# Patient Record
Sex: Male | Born: 1945 | Race: White | Hispanic: No | Marital: Married
Health system: Southern US, Community
[De-identification: ages and names within clinical notes are randomized; demographics above are authoritative.]

---

## 2014-11-20 ENCOUNTER — Inpatient Hospital Stay: Payer: Self-pay | Admitting: Internal Medicine

## 2014-11-21 DIAGNOSIS — I4891 Unspecified atrial fibrillation: Secondary | ICD-10-CM

## 2014-11-21 DIAGNOSIS — J9600 Acute respiratory failure, unspecified whether with hypoxia or hypercapnia: Secondary | ICD-10-CM

## 2014-11-21 DIAGNOSIS — D649 Anemia, unspecified: Secondary | ICD-10-CM

## 2014-11-21 DIAGNOSIS — I471 Supraventricular tachycardia: Secondary | ICD-10-CM

## 2014-11-23 ENCOUNTER — Ambulatory Visit (HOSPITAL_COMMUNITY)
Admission: AD | Admit: 2014-11-23 | Discharge: 2014-11-23 | Disposition: A | Discharge status: Home / Self Care | Payer: Self-pay | Source: Other Acute Inpatient Hospital | Attending: Internal Medicine | Admitting: Internal Medicine

## 2014-11-23 DIAGNOSIS — R0602 Shortness of breath: Secondary | ICD-10-CM | POA: Insufficient documentation

## 2015-01-25 NOTE — Consult Note (Signed)
General Aspect 69 year old Caucasian male with past medical history significant for history of COPD, history of tobacco abuse, hypothyroidism, history of head and neck cancer, status post radiation therapy to the neck after which he developed dysphagia, history of aspiration pneumonitis in the past comes to the hospital with shortness of breath.  Cardiology was consulted for tachycardia.  The patient has been short of breath for the past few days. He was noted to have O2 saturations in 70-79 on 4 liters of oxygen through facial mask and he was brought to Emergency Room for further evaluation.   In the Emergency Room he was noted to be febrile with temperature of 103, tachycardic with heart rate ranging from 100s to 160s.   white blood cell count was also found to be elevated at 25,000. Chest x-ray reveals possible pneumonia.  Review of tele and EKG shows possible SVT with rates of 150s. Predominantly in normal sinus rhythm with rates in the 80s to 90 range. Acute onset of several runs of narrow complex arrhythmia with rates in the 150s lasting for hours at a time. He denies any sx. Unable to exclude 2:1 atrial flutter, though EKG more consistent with SVT.   Physical Exam:  GEN well developed, well nourished, cachectic, thin, critically ill appearing   HEENT hearing intact to voice, moist oral mucosa   NECK supple   RESP postive use of accessory muscles  no use of accessory muscles  rhonchi  crackles   CARD Regular rate and rhythm   ABD soft  normal BS   LYMPH negative neck   EXTR negative edema   SKIN normal to palpation   NEURO motor/sensory function intact   PSYCH alert, A+O to time, place, person, good insight   Review of Systems:  Subjective/Chief Complaint SOB   General: No Complaints   Skin: No Complaints   ENT: No Complaints   Eyes: No Complaints   Neck: No Complaints   Respiratory: Frequent cough  Short of breath   Cardiovascular: No Complaints    Gastrointestinal: No Complaints   Genitourinary: No Complaints   Vascular: No Complaints   Musculoskeletal: No Complaints   Neurologic: No Complaints   Hematologic: No Complaints   Endocrine: No Complaints   Psychiatric: No Complaints   Review of Systems: All other systems were reviewed and found to be negative   Medications/Allergies Reviewed Medications/Allergies reviewed   Family & Social History:  Family and Social History:  Family History Coronary Artery Disease    Social History positive  tobacco   + Tobacco Prior (greater than 1 year)    Place of Living Nursing Home      gastrostomy tube:    aspiration pneumonia:    secondary malignant neoplasm of genital organs:    squamous cell carcinoma:    anemia:    hypertension:    hypothyroidism:    clostridium difficile:        Admit Diagnosis:   SPECIFIED WHETHER WITH HYPOXIA OR HYPO.: Onset Date: 21-Nov-2014, Status: Active, Description: SPECIFIED WHETHER WITH HYPOXIA OR HYPO.  Home Medications: Medication Instructions Status  metoprolol 5 milligram(s) injectable every 4 hours, As needed, tachycardia Active  Vital 1.5 Cal RTH   74m/hr  flush with 25cc h2o flushes q1H Active  budesonide 0.5 mg/2 mL inhalation suspension 0.5 milligram(s) inhaled 2 times a day Active  lactobacillus acidophilus 1 cap(s) orally 3 times a day Active  piperacillin-tazobactam 3.375 gram(s) intravenous every 8 hours Active  vancomycin 750 milligram(s) intravenous every 8  hours Active  budesonide-formoterol 160 mcg-4.5 mcg/inh inhalation aerosol 2 puff(s) inhaled 2 times a day Active  albuterol 2.5 milligram(s) inhaled every 4 hours Active  morphine 2 milligram(s) injectable every 4 hours, As needed, severe pain (7-10/10) Active  heparin 5000 unit(s) subcutaneous every 8 hours Active  oxyCODONE 2.5 milligram(s) orally every 6 hours, As needed, moderate pain (4-6/10) Active  methylPREDNISolone 40 milligram(s) injectable once a  day Active  acetaminophen 325 mg oral tablet 2 tab(s) orally every 4 hours, As needed, mild pain (1-3/10) or temp. greater than 100.4 Active  diltiazem 30 mg oral tablet 1 tab(s) orally every 6 hours Active  tiotropium 18 mcg inhalation capsule 1 cap(s) inhaled once a day Active  magnesium oxide 400 mg (241.3 mg elemental magnesium) oral tablet 2 tab(s) orally once a day Active  ALPRAZolam 0.5 mg oral tablet 1 tab(s) orally every 8 hours, As needed, anxiety Active  Prevacid 30 mg oral tablet, disintegrating 1 tab(s) orally 2 times a day Active  Synthroid 125 mcg (0.125 mg) oral tablet 1 tab(s) orally once a day Active  folic acid 1 mg oral tablet 1 tab(s) orally once a day Active  aspirin 81 mg oral tablet 1 tab(s) orally once a day Active  multivitamin 5 milliliter(s) orally once a day Active  thiamine 100 mg oral tablet 1 tab(s) orally once a day Active  nicotine 14 mg/24 hr transdermal film, extended release 1 patch transdermal once a week (on Friday) Active  Artificial Tears preserved ophthalmic solution 1 drop(s) to each affected eye 3 times a day, As Needed for dry eyes.  Active  Biotene Moisturizing Mouth Spray 2 spray(s) orally every 2 hours, As Needed for dry mouth.  Active  ipratropium nasal 21 mcg/inh nasal spray 2 spray(s) nasal 3 times a day, As Needed for allergies.  Active   Lab Results:  Routine Chem:  25-Feb-16 23:55   Glucose, Serum  150  BUN  19  Creatinine (comp) 0.99  Sodium, Serum 139  Potassium, Serum 4.4  Chloride, Serum 102  CO2, Serum 31  Calcium (Total), Serum  8.4  Anion Gap  6  Osmolality (calc) 283  eGFR (African American) >60  eGFR (Non-African American) >60 (eGFR values <30m/min/1.73 m2 may be an indication of chronic kidney disease (CKD). Calculated eGFR, using the MRDR Study equation, is useful in  patients with stable renal function. The eGFR calculation will not be reliable in acutely ill patients when serum creatinine is changing rapidly. It  is not useful in patients on dialysis. The eGFR calculation may not be applicable to patients at the low and high extremes of body sizes, pregnant women, and vegetarians.)  Cardiac:  25-Feb-16 23:55   CK, Total 84  CPK-MB, Serum 1.8 (Result(s) reported on 21 Nov 2014 at 12:25AM.)  Troponin I < 0.02 (0.00-0.05 0.05 ng/mL or less: NEGATIVE  Repeat testing in 3-6 hrs  if clinically indicated. >0.05 ng/mL: POTENTIAL  MYOCARDIAL INJURY. Repeat  testing in 3-6 hrs if  clinically indicated. NOTE: An increase or decrease  of 30% or more on serial  testing suggests a  clinically important change)  Routine Hem:  25-Feb-16 23:55   WBC (CBC)  29.8  RBC (CBC)  2.38  Hemoglobin (CBC)  7.6  Hematocrit (CBC)  24.2  Platelet Count (CBC)  480  MCV  102  MCH 31.8  MCHC  31.2  RDW  17.5  Neutrophil % 95.4  Lymphocyte % 1.2  Monocyte % 3.4  Eosinophil % 0.0  Basophil % 0.0  Neutrophil #  28.4  Lymphocyte #  0.4  Monocyte # 1.0  Eosinophil # 0.0  Basophil # 0.0 (Result(s) reported on 21 Nov 2014 at 12:19AM.)   EKG:  Interpretation EKG shows SVT with rate 150 bpm   Radiology Results: XRay:    25-Feb-16 16:38, Chest Portable Single View  Chest Portable Single View   REASON FOR EXAM:    Sepsis  COMMENTS:       PROCEDURE: DXR - DXR PORTABLE CHEST SINGLE VIEW  - Nov 20 2014  4:38PM     CLINICAL DATA:  Shortness of breath.  Sepsis.    EXAM:  PORTABLE CHEST - 1 VIEW    COMPARISON:  None.    FINDINGS:  Two frontal radiographs. Lower right-sided rib fractures, favored to  be nonacute. Midline trachea. Mild cardiomegaly with transverse  aortic atherosclerosis. Layering small left pleural effusion. No  pneumothorax. Patchy left base airspace disease. Suspect a nipple  shadow or calcified granuloma projecting over the right mid lung.     IMPRESSION:  Layering left pleural effusion with adjacent atelectasis or  infection. Recommend radiographic follow-up until  clearing.    Multiple right rib fractures, likely nonacute.    Favor nipple shadow or granuloma project over the right mid lung.  Recommend attention on follow-up.      Electronically Signed    By: Abigail Miyamoto M.D.    On: 11/20/2014 16:53         Verified By: Areta Haber, M.D.,    Tramadol: Unknown  Strawberry: Unknown  Bee Stings: Unknown  Vital Signs/Nurse's Notes: **Vital Signs.:   26-Feb-16 17:25  Vital Signs Type Blood Transfusion Complete  Temperature Temperature (F) 98  Celsius 36.6  Temperature Source oral  Pulse Pulse 87  Respirations Respirations 20  Systolic BP Systolic BP 95  Diastolic BP (mmHg) Diastolic BP (mmHg) 71  Mean BP 79  Pulse Ox % Pulse Ox % 91  Oxygen Delivery 6L; Nasal Cannula    Impression 68y/oM with PMH of Head and neck cancer status post radiation with hoarseness, PEG tube, history of SVT/sinus tach, Hypertension, recent prolonged hospitalisation at [place]- in jan 2016 admitted for acute resp failure, SOB, SVT on EKG and tele  1) Arrhythmia/SVT Unable to advance diltiazem for rhythm control, --Will start amiodarone 400 mg po BID, extra amiodarone as needed for breaththrough arrhythmia. Arrhythmia possibly exacerbated by underlying infection/anemia  2) Acute resp failure-  secondary to pneumonia- HCAP vs aspiration on Bipap yesterday- just weaned to 6l nasal cannula results from CT showing dense left sided consolidation, central obstructing mass cannot be ruled on vanc, zosyn. solumedrol and inh  blood cultures pending  3)  Sepsis-  fevers controlled, not on pressors. BP stable  4) Acute on chronic anemia-  1unit Tx today Possibly component of dilutional  5) COPD-  steroids, inh  6) Head and neck cancer status post radiation trts-  dysphagia with PEG- continue tube feeds  7) Tobacco use disorder-  nicotine patch   Electronic Signatures: Ida Rogue (MD)  (Signed 26-Feb-16 18:11)  Authored: General Aspect/Present  Illness, History and Physical Exam, Review of System, Family & Social History, Past Medical History, Health Issues, Home Medications, Labs, EKG , Radiology, Allergies, Vital Signs/Nurse's Notes, Impression/Plan   Last Updated: 26-Feb-16 18:11 by Ida Rogue (MD)

## 2015-01-25 NOTE — Discharge Summary (Signed)
PATIENT NAME:  Juliann PulseDAMS, Bessie S MR#:  [id number] DATE OF BIRTH:  1946/01/06  DATE OF ADMISSION:  11/20/2014 DATE OF DISCHARGE:  11/23/2014  ADDENDUM  ADMITTING PHYSICIAN: Katharina Caperima Vaickute, M.D.     DISCHARGING PHYSICIAN: Enid Baasadhika Katniss Weedman, M.D.    [facility]YSIC[facility].   CONSULTATIONS IN THE HOSPITAL:  1. Pulmonary critical care consultation with Dr. Belia HemanKasa.  2. Cardiology consultation with Dr. Mariah MillingGollan.   For more details and for the discharge diagnoses and medications, please look at the discharge summary dictated by Dr. Nemiah CommanderKalisetti on 11/21/2014.   The only discharge medication that was added onto the list of medications already dictated was amiodarone 400 mg p.o. b.i.d.   IN BRIEF: [facility] for sepsis with fever, elevated white count, hypoxia, noted to have left-sided pneumonia. He has left lower lobe complete consolidation versus collapse and central obstructing lesion cannot be ruled out; however, had recently a hospitalization and significant work-up including bronchoscopy done. His white count was increasing. He was on broad-spectrum antibiotics with vancomycin and Zosyn. His blood cultures have remained negative so far. [facility]. He was on BiPAP on admission and currently weaned down to 5 liters nasal cannula.   He developed SVT in the hospital. He states he was [facility], too.  With his blood pressure being limited, he was started on metoprolol and Cardizem, and his heart rate finally was controlled with amiodarone IV drip. He was changed over to p.o. amiodarone 400 b.i.d. at this time.   [facility] for the last 2 days and he is being transferred today.   His condition has remained guarded.   DISCHARGE DISPOSITION: To [facility].   ADDITIONAL TIME SPENT: 35  minutes.   ____________________________ Enid Baasadhika Kristy Catoe, MD rk:JT D: 11/23/2014 11:43:50 ET T: 11/23/2014 12:23:34 ET JOB#: [id number]  cc: Enid Baasadhika Dowell Hoon, MD, <Dictator> Enid BaasADHIKA Aivan Fillingim MD ELECTRONICALLY SIGNED 12/11/2014 17:51

## 2015-01-25 NOTE — Discharge Summary (Signed)
PATIENT NAME:  Juliann PulseDAMS, Adel S MR#:  [id number] DATE OF BIRTH:  03-Aug-1946  DATE OF ADMISSION:  11/20/2014 DATE OF DISCHARGE:  11/21/2014   ADMITTING PHYSICIAN: Katharina Caperima Vaickute, MD   TRANSFERRING PHYSICIAN: Enid Baasadhika Ludwig Tugwell, MD   PRIMARY CARE PHYSICIAN[facility].   CONSULTATIONS IN THE HOSPITAL:  1.  Pulmonary critical care consultation by Dr. Belia HemanKasa.  2.  Cardiology consultation by Dr. Mariah MillingGollan is pending at this time.    TRANSFER DIAGNOSES: 1. Acute hypoxic respiratory failure, currently on 5-6 liters of oxygen via nasal cannula.  2.  Left-sided pneumonia with possible central obstructing mass cannot be ruled out. However, [facility], this left-sided consolidation is a chronic finding for the patient. He had mucous plug and left lower lobe collapse in 1st week, February 2016.  3.  Sepsis and pneumonia.  4. Supraventricular tachycardia/sinus tachycardia, requiring metoprolol pushes.  5.  Head and neck cancer.  6.  Dysphagia, status post percutaneous endoscopic gastrostomy tube.  7.  Anemia, acute on chronic, status post 1 unit packed red blood cell transfusion.  8.  Recent Clostridium difficile colitis, 2 weeks ago.  9.  Chronic obstructive pulmonary disease.  10.  Tobacco use disorder.   DISCHARGE MEDICATIONS: 1.  Prevacid 30 mg p.o. b.i.d.  2.  Synthroid 125 mcg p.o. daily.  3.  Folic acid 1 mg p.o. daily.  4.  Aspirin 81 mg p.o. daily.  5.  Multivitamin 5 mL via percutaneous endoscopic gastrostomy tube daily.  6.  Thiamine 100 mg daily.  7.  Nicotine patch 14 mg transdermal once a week on Fridays.  8.  Artificial Tears 1 drop each eye 3 times a day.  9.  Biotene moisturizing mouth spray 2 sprays every 2 hours as needed for dry mouth.  10.  Ipratropium nasal spray 2 sprays 3 times a day as needed for allergies.  11.  Tylenol 650 mg q. 4 hours p.r.n. for pain.  12.  Oxybutynin 2.5 mg every 6 hours as needed.  13.  Albuterol nebulizer 2.5 mg inhaled every  4 hours as needed.  14.  Magnesium oxide 800 mg p.o. daily.  15.  Methylprednisone 40 mg IV daily.  16.  Heparin subcutaneous 5000 units every 8 hours for DVT prophylaxis.  17.  Morphine 2 mg IV q. 4 hours p.r.n. for severe pain.  18.  Symbicort 160/4.5 mcg 2 puffs b.i.d.  19.  Cardizem 30 mg q. 6 hours.  20.  Spiriva inhaler 1 capsule daily.  21.  Vancomycin 750 mg IV q. 8 hours.  22.  Zosyn 3.375 grams IV q. 8 hours.  23.  Probiotic capsules 3 times a day.  24.  Pulmicort nebulizers 0.5 mg twice a day.  25.  Xanax 0.5 mg q. 8 hours p.r.n. for anxiety.  26.  Metoprolol 5 mg injectable q. 4 hours p.r.n. for tachycardia.   DISCHARGE DIET: Tube feeds Vital 1.5 calories RTH at 35 mL  per hour, flush with 25 mL water q. 1 hour.   DISCHARGE OXYGEN: 6 liters via nasal cannula.   DISCHARGE ACTIVITY: As tolerated.    FOLLOWUP INSTRUCTIONS: [facility].   LABORATORIES AND IMAGING STUDIES: Done here: WBC count is 29.8, hemoglobin is 7.6, hematocrit 24.2, platelet count 480,000. Sodium 139, potassium 4.4, chloride 102, bicarbonate 31, BUN 19, creatinine 0.99, glucose 150, and calcium of 8.4. CK 84, CK-MB 1.8. Troponin is less than 0.02 x 3. ABG showing  pH of 7.42, pCO2 of 51, pO2 of 89, bicarbonate of 33, and saturations of 99% on 70% FiO2. Blood cultures so far negative. Chest x-ray on admission showing layering left pleural effusion with edges and atelectasis; multiple rib fractures, which are nonacute, and patchy left base airspace disease. Urinalysis negative for any infection. CT of the chest done with contrast on February 26, showing dense airspace consolidation involving entire left lower lobe, associated narrowing of left inferior lobar bronchi, central obstructing mass cannot be excluded, follow-up imaging is recommended. If it does not resolve, endoscopy might be needed to assess for underlying malignancy. There are right paratracheal  lymph nodes, which are noted as well, and atherosclerotic disease emphysematous changes also noted, as well.   BRIEF HOSPITAL COURSE: Mr. Scaife is a 69 year old emaciated, malnourished-appearing Caucasian male with multiple medical problems, including head and neck cancer status post multiple radiation with resultant dysphagia and PEG tube, gastroesophageal reflex disease, chronic coronary artery disease, multiple nutritional deficiencies, hypothyroidism, recent Clostridium difficile colitis, and hospitalization to [facility] from mid-January to mid-February prolonged hospital course, so was just discharged 2 weeks ago to [facility], was brought for sepsis with hypoxia, hypertension, tachycardia, and also fever, elevated white count of greater than 20,000.  1.  Sepsis and acute hypoxic respiratory failure secondary to pneumonia. His initial chest x-ray showed left-sided infiltrate. Ct showed complete consolidation of left lower lobe and possible narrowing of the lower lobe bronchial branches and central obstructing mass cannot be ruled out. The patient was on BiPAP initially, now weaned to 6 liters nasal cannula. [facility] just now, it seems like the patient had a CT done during his recent hospitalization 2 weeks ago at the [place] and that also had possible mucous plugging, left-sided consolidation, and the patient actually seems like he had a bronchoscopy done at that time. Because he presented with sepsis picture, blood cultures were done, and they are pending. He is on vancomycin and Zosyn broad-spectrum at this time and has remained afebrile so far. He was also seen by pulmonologist, Dr. Belia Heman, here in the hospital.  2.  Acute on chronic anemia, unknown baseline, dropped yesterday, from 9 to 7.6 today, but because he is symptomatic with tachycardia and septic, we will give him 1 unit of packed RBCs transfusion at this time.  3.  Supraventricular tachycardia/sinus tachycardia. Again,  was treated for rapid heart rate at [place], unknown diagnosis. Here, it seems more like sinus tachycardia with occasional supraventricular tachycardias. Cardiology consult and echocardiogram are pending, was responding to metoprolol IV p.r.n. We will continue that and Cardizem 30 q. 6 hours. Standing order has been started at this time.  4.  Chronic obstructive pulmonary disease. Started on steroids. All of his inhalers are being continued.  5.  Tobacco use disorder. On nicotine patch.  6.  Dysphagia, status post percutaneous endoscopic gastrostomy tube. Some erythema noted around the percutaneous endoscopic gastrostomy tube site, but tube feeds are being started today.  7.  The patient has multiple medical problems. He recently also had Clostridium difficile colitis.  He is ill and a  high risk for cardiorespiratory arrest. The patient is presently waiting for a bed to be transferred to [facility].   ACCEPTING PHYSICIAN:  Dr. Wendall Papa.  TOTAL TIME SPENT ON TRANSFERRING THE PATIENT: 45 minutes.   CODE STATUS: Full code.   DISCHARGE CONDITION: Is guarded.     ____________________________ Enid Baas, MD rk:mw D: 11/21/2014 16:02:51 ET T: 11/21/2014 16:18:30  ET JOB#: [id number]  cc: Enid Baas, MD, <Dictator> [facility] Enid Baas MD ELECTRONICALLY SIGNED 12/11/2014 17:51

## 2015-01-25 NOTE — H&P (Signed)
PATIENT NAME:  Tim Serrano, Tim Serrano MR#:  [id number] DATE OF BIRTH:  1945-10-15  DATE OF ADMISSION:  11/20/2014  PRIMARY CARE PHYSICIAN: [facility] as well as [facility].    HISTORY OF PRESENT ILLNESS::  The patient is a 69 year old Caucasian male with past medical history significant for history of COPD, history of tobacco abuse, hypothyroidism, history of head and neck cancer, status post radiation therapy to the neck after which he developed dysphagia, history of aspiration pneumonitis in the past comes to the hospital with shortness of breath.  The patient himself unfortunately is not able to provide much history as well as review of systems, but according to medical records, the patient has been short of breath for the past few days or at least one day.  He was noted to have O2 saturations in 70-79 on 4 liters of oxygen through facial mask and he was brought to Emergency Room for further evaluation. In the Emergency Room he was noted to be febrile with temperature of 103. He was also noted to be tachycardic with heart rate ranging from 100s to 160s. Hospitalist services were contacted for admission since his white blood cell count was also found to be elevated at 25,000. Chest x-ray reveals possible pneumonia.   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   MEDICATIONS:  According to medical records, the patient is on  artificial tears one drop to each affected eye 3 times daily as needed, aspirin 81 mg p.o. daily, biotin two sprays every two hours as needed, folic acid 1 mg once daily, ipratropium two sprays 3 times daily as needed, nasal spray, magnesium oxide 400 mg 2 tablets once daily, multivitamins once daily, nicotine transdermal film 14 mg topically daily, oxycodone 2.5 mg every six hours as needed, Prevacid 30 mg daily, ProAir HFA 2 puffs every two hours as needed, Synthroid 125 mcg daily. Thiamine 100 mg daily, Tylenol 325 mg three tablets every eight hours as needed.   ALLERGIES: THE PATIENT IS ALLERGIC TO BE BEE STING, STRAWBERRY AS WELL AS TRAMADOL.   FAMILY HISTORY, SOCIAL HISTORY AND REVIEW OF SYSTEMS: Not available as the patient is not able to provide since he has difficulty speaking because of his oropharyngeal history of cancer as well as BiPAP now on in the Emergency Room.   PHYSICAL EXAMINATION:   VITAL SIGNS:  On arrival to the hospital the patient's vital signs:  Temperature was 103.2, pulse 160, respirations was 24, blood pressure 90/68, saturation was 88% on oxygen therapy.  GENERAL: This is a well-developed, well-nourished, thin, Caucasian male in moderate distress secondary to respiratory failure, sitting on the stretcher.  HEENT: Pupils equal, react to light, extraocular movements intact. No icterus or conjunctivitis. Has normal hearing. No pharyngeal erythema. Mucosa is dry. The patient has somewhat indurated neck tissues. He has BiPAP mask on his mouth. He seemed to be very uncomfortable and has very low tolerance to frustration. NECK:  Supple, nontender, no masses. Thyroid is not enlarged. No adenopathy. No JVD or carotid bruits bilaterally.  Full range of motion.  LUNGS:  Rales, rhonchi as well as diminished breath sounds bilaterally. Some wheezes as well as labored inspirations as well as increased effort to breathe and patient is in moderate respiratory distress.  CARDIOVASCULAR: S1, S2  appreciated. Rhythm is regular, distant, PMI lateralized. Chest is nontender to palpation, 1+ pedal pulses.  EXTREMITIES: No lower extremity edema, calf tenderness, or cyanosis.  ABDOMEN: Soft, nontender. Bowel sounds are present. No hepatosplenomegaly or masses were noted. The patient does have G-tube placed in mid abdomen. No hepatosplenomegaly or masses were noted.  RECTAL: Deferred.  MUSCLE STRENGTH: Able to move all extremities, has some mild kyphosis. Gait is not tested.  SKIN: Did not reveal any rashes, lesions, erythema, nodularity, or induration. It was warm and dry to palpation. Mild  erythema was noted around the G-tube entrance site, but no inflammation. No drainage.  LYMPHATIC: No adenopathy in the cervical region.  NEUROLOGIC: Cranial nerves grossly intact, difficult to assess his sensory. The patient is dysarthric.  The patient is alert, poorly cooperative,  agitated intermittently .    LABORATORY DATA:  Done on arrival to the hospital: Glucose level 100, BUN of 20, chloride 96, bicarbonate 35, otherwise, BMP unremarkable. Magnesium level was normal at 1.8, phosphorus 3.2, calcium 9.0. Liver enzymes: Albumin level of 2.0 and AST elevated at 59. Troponin is less than 0.02. White blood cell count is elevated to 21.8, hemoglobin was 9.4, platelet count was 609, MCV high at 101.  Absolute neutrophil count was 19.6. Coagulation panel: Pro time 14.9, INR 1.2.   Urinalysis: Yellow hazy urine, negative for glucose, bilirubin or ketones. Specific gravity 1.017, pH was 7.0. Negative for blood, protein, nitrites, trace leukocyte esterase, 1 red blood cell, 21 white blood cells, no bacteria or epithelial cells were noted. Lactic acid level was 2.2.   RADIOLOGIC STUDIES: Chest x-ray, portable single view, 11/20/2014 showed layering left pleural effusion with adjacent atelectasis or infection. Recommend radiographic follow up until clearing.  Multiple right rib fractures likely nonacute. Favor nipple  shadow or granuloma project  over the right mid lung, recommend attention on  follow up according to radiologist.   ASSESSMENT AND PLAN:  1.  Sepsis. Admit patient to medical floor. Get blood cultures, urine cultures, sputum cultures if possible as well as stool cultures; start him on antibiotic therapy with vancomycin, Zosyn as well as Levaquin IV. 2.  Bilateral pneumonia due to unknown bacterial agent.  Cannot rule out aspiration. Get sputum cultures, and adjust antibiotic according to culture needs.  3.  Chronic obstructive pulmonary disease exacerbation.  Start patient on Solu-Medrol, nebulizers as well as inhalers.  4.  Acute respiratory failure with hypoxia and likely hypercapnia.  Continue oxygen therapy as needed, intubate as needed. Continue BiPAP, and get ABGs.  5.  Leukocytosis, follow with therapy.   TIME SPENT:  One hour.    ____________________________ Katharina Caperima Glenisha Gundry, MD rv:at D: 11/20/2014 18:29:45 ET T: 11/20/2014 21:48:20 ET JOB#: [id number]  cc: Katharina Caperima Nghia Mcentee, MD, <Dictator> [facility]  Makyah Lavigne MD ELECTRONICALLY SIGNED 12/28/2014 12:47

## 2015-05-29 IMAGING — CR DG CHEST 2V
1 series · 2 of 2 positions shown · non-contrast
Comparison: 11/21/2014 CT and 11/20/2014 chest x-ray

CLINICAL DATA: Admitted on 11/20/2014 for fever from the COPD,
pneumonia.

EXAM:
CHEST  2 VIEW

[Series 1: dxr chest pa (or ap) and lateral · 0.14mm/px · 2 of 2 slices shown]
[im 1/2]
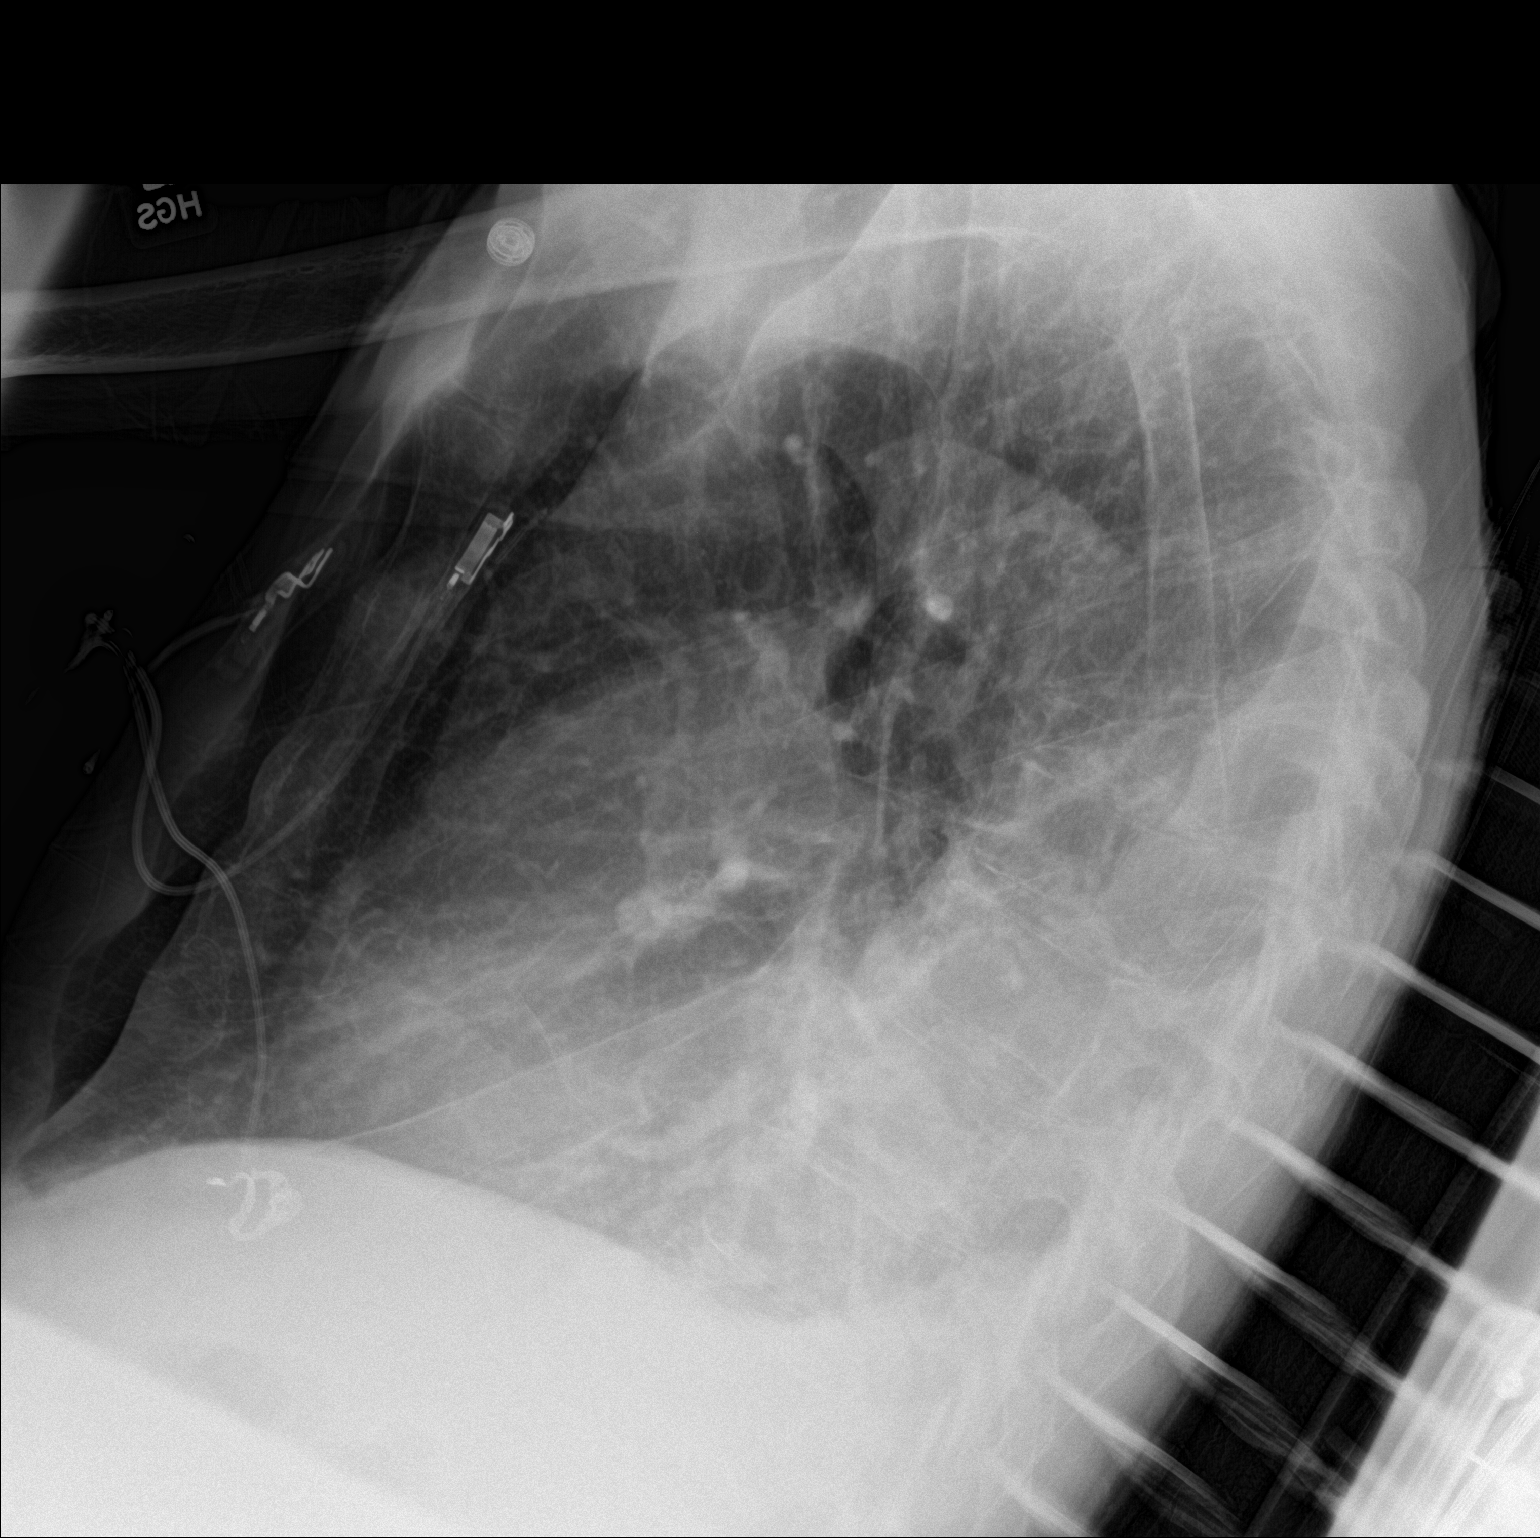
[im 2/2]
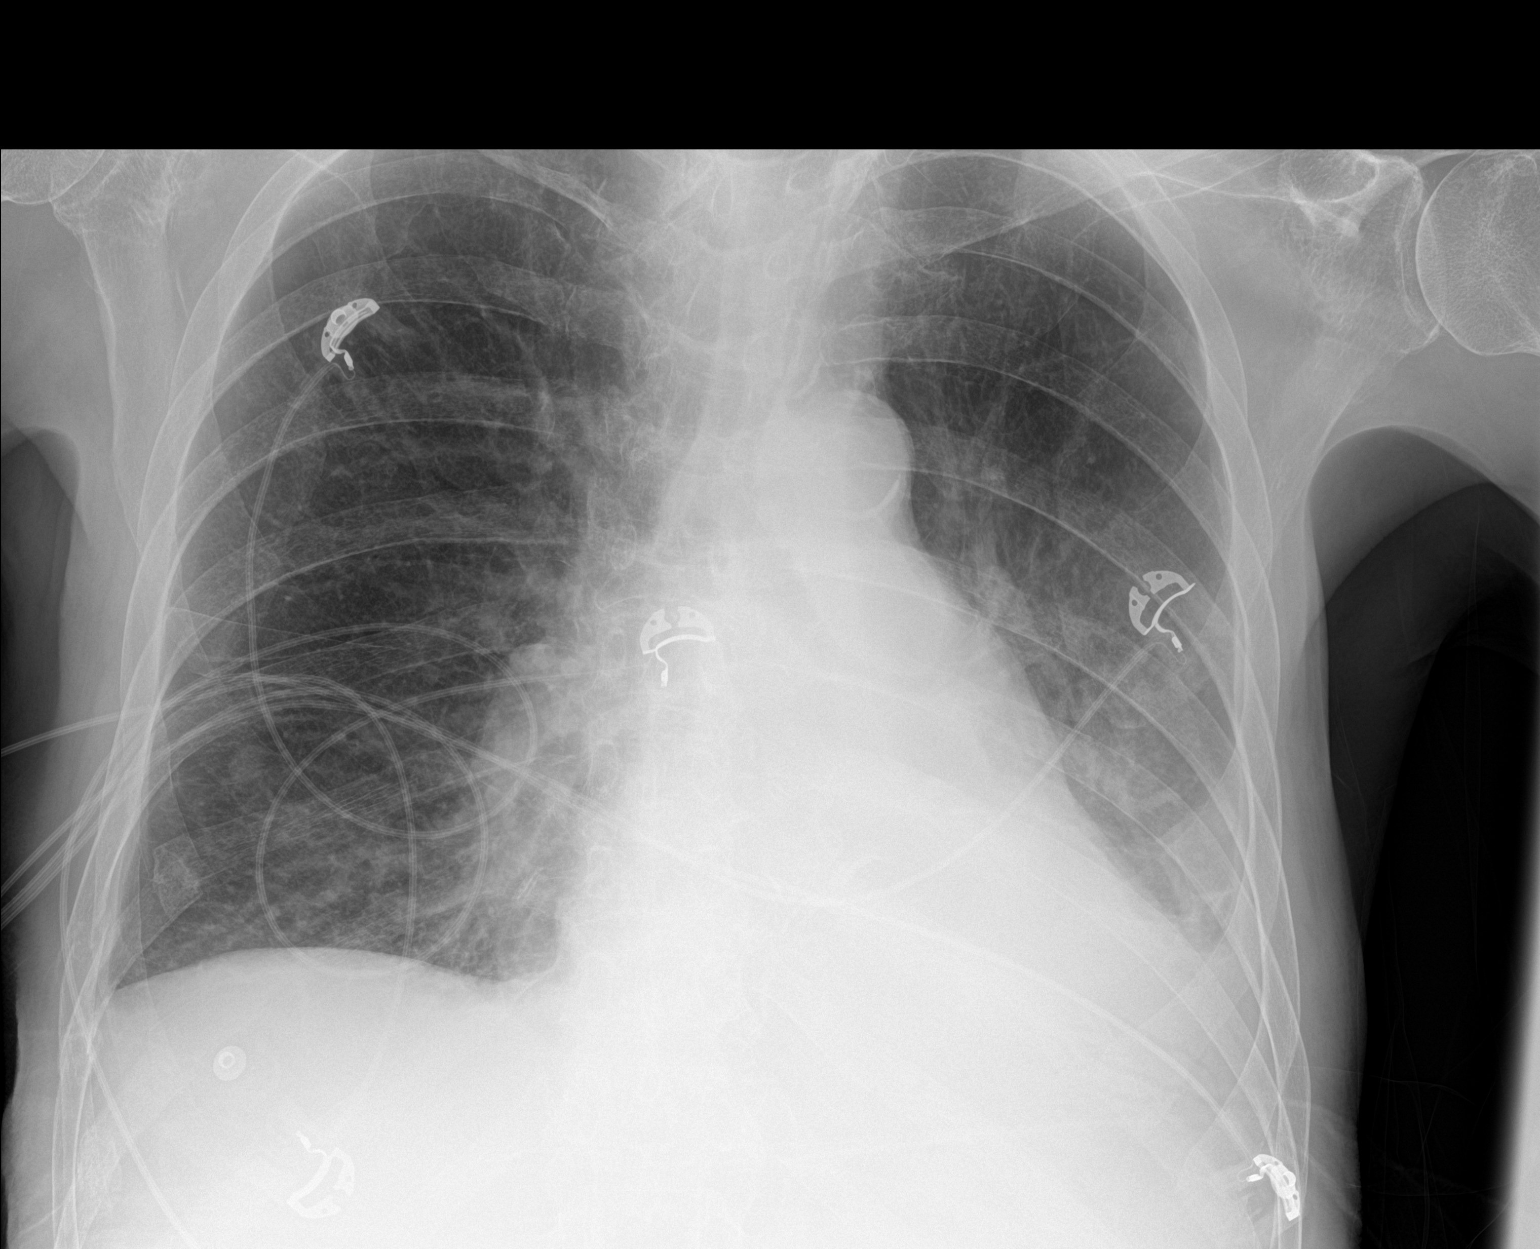

[2 of 2 positions shown; findings below may reference images not displayed]

FINDINGS: Heart is enlarged in and stable in appearance. There is dense
opacity at the left lung base associated with small pleural effusion
as seen on the chest CT. No pulmonary edema. Stable appearance of
nodule overlying the right lung base, consistent with prominent
nipple shadow. Old right rib fractures.
IMPRESSION: Persistent right lower lobe infiltrate.

## 2015-12-26 DEATH — deceased
